# Patient Record
Sex: Female | Born: 1990 | Race: Black or African American | Hispanic: No | Marital: Single | Smoking: Never smoker
Health system: Southern US, Community
[De-identification: ages and names within clinical notes are randomized; demographics above are authoritative.]

## PROBLEM LIST (undated history)

## (undated) DIAGNOSIS — R55 Syncope and collapse: Secondary | ICD-10-CM

## (undated) DIAGNOSIS — E669 Obesity, unspecified: Secondary | ICD-10-CM

## (undated) HISTORY — PX: OTHER SURGICAL HISTORY: SHX169

## (undated) HISTORY — DX: Obesity, unspecified: E66.9

## (undated) HISTORY — DX: Syncope and collapse: R55

---
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
## 2013-06-09 ENCOUNTER — Encounter: Payer: Self-pay | Admitting: Neurology

## 2013-06-11 ENCOUNTER — Encounter (INDEPENDENT_AMBULATORY_CARE_PROVIDER_SITE_OTHER): Payer: Self-pay

## 2013-06-11 ENCOUNTER — Ambulatory Visit (INDEPENDENT_AMBULATORY_CARE_PROVIDER_SITE_OTHER): Payer: BC Managed Care – PPO | Admitting: Neurology

## 2013-06-11 ENCOUNTER — Encounter: Payer: Self-pay | Admitting: Neurology

## 2013-06-11 VITALS — BP 119/72 | HR 76 | Ht 70.0 in | Wt 287.0 lb

## 2013-06-11 DIAGNOSIS — R55 Syncope and collapse: Secondary | ICD-10-CM

## 2013-06-11 NOTE — Patient Instructions (Signed)
Syncope  Syncope is a fainting spell. This means the person loses consciousness and drops to the ground. The person is generally unconscious for less than 5 minutes. The person may have some muscle twitches for up to 15 seconds before waking up and returning to normal. Syncope occurs more often in elderly people, but it can happen to anyone. While most causes of syncope are not dangerous, syncope can be a sign of a serious medical problem. It is important to seek medical care.   CAUSES   Syncope is caused by a sudden decrease in blood flow to the brain. The specific cause is often not determined. Factors that can trigger syncope include:   Taking medicines that lower blood pressure.   Sudden changes in posture, such as standing up suddenly.   Taking more medicine than prescribed.   Standing in one place for too long.   Seizure disorders.   Dehydration and excessive exposure to heat.   Low blood sugar (hypoglycemia).   Straining to have a bowel movement.   Heart disease, irregular heartbeat, or other circulatory problems.   Fear, emotional distress, seeing blood, or severe pain.  SYMPTOMS   Right before fainting, you may:   Feel dizzy or lightheaded.   Feel nauseous.   See all white or all black in your field of vision.   Have cold, clammy skin.  DIAGNOSIS   Your caregiver will ask about your symptoms, perform a physical exam, and perform electrocardiography (ECG) to record the electrical activity of your heart. Your caregiver may also perform other heart or blood tests to determine the cause of your syncope.  TREATMENT   In most cases, no treatment is needed. Depending on the cause of your syncope, your caregiver may recommend changing or stopping some of your medicines.  HOME CARE INSTRUCTIONS   Have someone stay with you until you feel stable.   Do not drive, operate machinery, or play sports until your caregiver says it is okay.   Keep all follow-up appointments as directed by your  caregiver.   Lie down right away if you start feeling like you might faint. Breathe deeply and steadily. Wait until all the symptoms have passed.   Drink enough fluids to keep your urine clear or pale yellow.   If you are taking blood pressure or heart medicine, get up slowly, taking several minutes to sit and then stand. This can reduce dizziness.  SEEK IMMEDIATE MEDICAL CARE IF:    You have a severe headache.   You have unusual pain in the chest, abdomen, or back.   You are bleeding from the mouth or rectum, or you have black or tarry stool.   You have an irregular or very fast heartbeat.   You have pain with breathing.   You have repeated fainting or seizure-like jerking during an episode.   You faint when sitting or lying down.   You have confusion.   You have difficulty walking.   You have severe weakness.   You have vision problems.  If you fainted, call your local emergency services (911 in [place] Do not drive yourself to the hospital.   MAKE SURE YOU:   Understand these instructions.   Will watch your condition.   Will get help right away if you are not doing well or get worse.  Document Released: 06/05/2005 Document Revised: 12/05/2011 Document Reviewed: 08/04/2011  [employer] Patient Information 2014 [employer]

## 2013-06-11 NOTE — Progress Notes (Signed)
Reason for visit: Syncope  Julie Stuart is a 22 y.o. female  History of present illness:  Julie Stuart is a 22 year old right-handed black female with a history of syncopal events that have occurred off and on over the last 3-4 years. The patient has had 3 definite events of syncope, 2 occurred shortly after a shower. The patient indicates that the water was not extremely hot. The patient has had several episodes of feeling lightheaded, with dimming of vision, without syncope. The patient at times has enough warning that she can lie down, and let the event past. The patient does not have diaphoresis, palpitations of the heart, shortness of breath, or chest pain with the events. The most recent event occurred approximately 2 months ago. This occurred when she was hugging someone, and she then blacked out without warning. The patient regained consciousness almost as soon as she hit the floor. The patient did not have any jerking, tongue biting, or bowel or bladder incontinence. The patient felt somewhat confused afterwards, and no diaphoresis or nausea was noted. The patient has no focal numbness or weakness of the face, arms, or legs, and she denies memory problems or confusion. The patient denies problems controlling the bowels or the bladder. The patient has had some occasional headaches since going on birth control pills recently. The patient is sent to this office for further evaluation. Routine blood work that includes a chemistry profile, CBC, hemoglobin A1c, thyroid profile, and an ANA were negative.  Past Medical History  Diagnosis Date  . Syncope and collapse   . Obesity     Past Surgical History  Procedure Laterality Date  . None      Family History  Problem Relation Age of Onset  . Syncope episode Neg Hx   . Seizures Neg Hx     Social history:  reports that she has never smoked. She has never used smokeless tobacco. She reports that she does not drink alcohol or use  illicit drugs.  Medications:  No current outpatient prescriptions on file prior to visit.   No current facility-administered medications on file prior to visit.     No Known Allergies  ROS:  Out of a complete 14 system review of symptoms, the patient complains only of the following symptoms, and all other reviewed systems are negative.  Blurred vision Increased thirst Confusion Dizziness, passing out Anxiety  Blood pressure 119/72, pulse 76, height 5\' 10"  (1.778 m), weight 287 lb (130.182 kg).  Blood pressure, standing, right arm is 132/70. Blood pressure, sitting, right arm is 128/62. Radial pulses are symmetric from one side to the next.  Physical Exam  General: The patient is alert and cooperative at the time of the examination. The patient is moderately obese.  Eyes: Pupils are equal, round, and reactive to light. Discs are flat bilaterally.  Neck: The neck is supple, no carotid bruits are noted.  Respiratory: The respiratory examination is clear.  Cardiovascular: The cardiovascular examination reveals a regular rate and rhythm, no obvious murmurs or rubs are noted.  Skin: Extremities are without significant edema.  Neurologic Exam  Mental status: The patient is alert and oriented x 3 at the time of the examination. The patient has apparent normal recent and remote memory, with an apparently normal attention span and concentration ability.  Cranial nerves: Facial symmetry is present. There is good sensation of the face to pinprick and soft touch bilaterally. The strength of the facial muscles and the muscles to head  turning and shoulder shrug are normal bilaterally. Speech is well enunciated, no aphasia or dysarthria is noted. Extraocular movements are full. Visual fields are full. The tongue is midline, and the patient has symmetric elevation of the soft palate. No obvious hearing deficits are noted.  Motor: The motor testing reveals 5 over 5 strength of all 4  extremities. Good symmetric motor tone is noted throughout.  Sensory: Sensory testing is intact to pinprick, soft touch, vibration sensation, and position sense on all 4 extremities. No evidence of extinction is noted.  Coordination: Cerebellar testing reveals good finger-nose-finger and heel-to-shin bilaterally.  Gait and station: Gait is normal. Tandem gait is normal. Romberg is negative. No drift is seen.  Reflexes: Deep tendon reflexes are symmetric, but are depressed bilaterally. Toes are downgoing bilaterally.   Assessment/Plan:  1. Episodic syncope  The episodes of syncope do not appear to be consistent with vasovagal events. The episodes occur without warning, and there is no description of what sounds like a seizure. The patient will be set up for MRI evaluation of the brain, and she will have an EKG and a 2-D echocardiogram. If the above results are unremarkable, the patient will be followed on a conservative basis. The patient will followup if needed. There is no family history of syncope.  Marlan Palau MD 06/11/2013 4:47 PM  [facility] [address] [place], [place] [address]  Phone [phone] Fax [phone]

## 2013-06-13 ENCOUNTER — Other Ambulatory Visit: Payer: Self-pay | Admitting: Neurology

## 2013-06-13 DIAGNOSIS — R55 Syncope and collapse: Secondary | ICD-10-CM

## 2013-06-29 ENCOUNTER — Other Ambulatory Visit: Payer: Self-pay

## 2013-07-02 ENCOUNTER — Other Ambulatory Visit (HOSPITAL_COMMUNITY): Payer: Self-pay

## 2013-07-12 ENCOUNTER — Ambulatory Visit
Admission: RE | Admit: 2013-07-12 | Discharge: 2013-07-12 | Disposition: A | Discharge status: Home / Self Care | Payer: BC Managed Care – PPO | Source: Ambulatory Visit | Attending: Neurology | Admitting: Neurology

## 2013-07-12 DIAGNOSIS — R55 Syncope and collapse: Secondary | ICD-10-CM

## 2013-07-14 ENCOUNTER — Telehealth: Payer: Self-pay | Admitting: Neurology

## 2013-07-14 NOTE — Telephone Encounter (Signed)
I called the patient. MRI the brain was completely normal. The 2-D echocardiogram and EKG are pending. The 2-D echocardiogram should be done on 07/25/2013.

## 2013-07-25 ENCOUNTER — Ambulatory Visit (HOSPITAL_COMMUNITY): Payer: BC Managed Care – PPO | Attending: Internal Medicine | Admitting: Cardiology

## 2013-07-25 ENCOUNTER — Encounter: Payer: Self-pay | Admitting: Cardiovascular Disease

## 2013-07-25 ENCOUNTER — Telehealth: Payer: Self-pay | Admitting: Neurology

## 2013-07-25 ENCOUNTER — Ambulatory Visit (INDEPENDENT_AMBULATORY_CARE_PROVIDER_SITE_OTHER): Payer: BC Managed Care – PPO | Admitting: Internal Medicine

## 2013-07-25 DIAGNOSIS — R55 Syncope and collapse: Secondary | ICD-10-CM

## 2013-07-25 DIAGNOSIS — E669 Obesity, unspecified: Secondary | ICD-10-CM | POA: Insufficient documentation

## 2013-07-25 NOTE — Progress Notes (Signed)
EKG  DONE  FOR  SYNCOPE  PER   DR WILLIS.

## 2013-07-25 NOTE — Progress Notes (Signed)
Echo performed. 

## 2013-07-25 NOTE — Telephone Encounter (Signed)
I called patient. The MRI the brain was unremarkable, 2-D echocardiogram was unremarkable, an EKG showed a heart rate of 57 with sinus bradycardia. I discussed this with patient. The patient has not had any further episodes.
